# Patient Record
Sex: Female | Born: 1985 | Race: Black or African American | Hispanic: No | Marital: Single | Smoking: Never smoker
Health system: Southern US, Community
[De-identification: ages and names within clinical notes are randomized; demographics above are authoritative.]

## PROBLEM LIST (undated history)

## (undated) ENCOUNTER — Inpatient Hospital Stay (HOSPITAL_COMMUNITY): Payer: Self-pay

## (undated) DIAGNOSIS — Z789 Other specified health status: Secondary | ICD-10-CM

## (undated) HISTORY — PX: NO PAST SURGERIES: SHX2092

---

## 2010-10-30 ENCOUNTER — Emergency Department (HOSPITAL_COMMUNITY)
Admission: EM | Admit: 2010-10-30 | Discharge: 2010-10-30 | Payer: Self-pay | Source: Home / Self Care | Admitting: Emergency Medicine

## 2010-11-06 LAB — URINALYSIS, ROUTINE W REFLEX MICROSCOPIC
Bilirubin Urine: NEGATIVE
Hgb urine dipstick: NEGATIVE
Ketones, ur: NEGATIVE mg/dL
Nitrite: NEGATIVE
Protein, ur: NEGATIVE mg/dL
Specific Gravity, Urine: 1.018 (ref 1.005–1.030)
Urine Glucose, Fasting: NEGATIVE mg/dL
Urobilinogen, UA: 0.2 mg/dL (ref 0.0–1.0)
pH: 7 (ref 5.0–8.0)

## 2010-11-06 LAB — POCT I-STAT, CHEM 8
BUN: 11 mg/dL (ref 6–23)
Calcium, Ion: 1.16 mmol/L (ref 1.12–1.32)
Chloride: 105 mEq/L (ref 96–112)
Creatinine, Ser: 0.8 mg/dL (ref 0.4–1.2)
Glucose, Bld: 84 mg/dL (ref 70–99)
HCT: 36 % (ref 36.0–46.0)
Hemoglobin: 12.2 g/dL (ref 12.0–15.0)
Potassium: 3.8 mEq/L (ref 3.5–5.1)
Sodium: 141 mEq/L (ref 135–145)
TCO2: 27 mmol/L (ref 0–100)

## 2010-11-06 LAB — URINE CULTURE
Colony Count: NO GROWTH
Culture  Setup Time: 201201090455
Culture: NO GROWTH

## 2010-11-06 LAB — POCT PREGNANCY, URINE: Preg Test, Ur: NEGATIVE

## 2010-12-09 ENCOUNTER — Emergency Department (HOSPITAL_COMMUNITY): Payer: Self-pay

## 2010-12-09 ENCOUNTER — Inpatient Hospital Stay (INDEPENDENT_AMBULATORY_CARE_PROVIDER_SITE_OTHER)
Admission: RE | Admit: 2010-12-09 | Discharge: 2010-12-09 | Disposition: A | Discharge status: Home / Self Care | Payer: Self-pay | Source: Ambulatory Visit | Attending: Family Medicine | Admitting: Family Medicine

## 2010-12-09 ENCOUNTER — Emergency Department (HOSPITAL_COMMUNITY)
Admission: EM | Admit: 2010-12-09 | Discharge: 2010-12-09 | Disposition: A | Discharge status: Home / Self Care | Payer: Self-pay | Attending: Emergency Medicine | Admitting: Emergency Medicine

## 2010-12-09 DIAGNOSIS — N760 Acute vaginitis: Secondary | ICD-10-CM | POA: Insufficient documentation

## 2010-12-09 DIAGNOSIS — O2390 Unspecified genitourinary tract infection in pregnancy, unspecified trimester: Secondary | ICD-10-CM | POA: Insufficient documentation

## 2010-12-09 DIAGNOSIS — R110 Nausea: Secondary | ICD-10-CM

## 2010-12-09 DIAGNOSIS — B9689 Other specified bacterial agents as the cause of diseases classified elsewhere: Secondary | ICD-10-CM | POA: Insufficient documentation

## 2010-12-09 DIAGNOSIS — Z3201 Encounter for pregnancy test, result positive: Secondary | ICD-10-CM | POA: Insufficient documentation

## 2010-12-09 DIAGNOSIS — O210 Mild hyperemesis gravidarum: Secondary | ICD-10-CM | POA: Insufficient documentation

## 2010-12-09 DIAGNOSIS — R112 Nausea with vomiting, unspecified: Secondary | ICD-10-CM

## 2010-12-09 DIAGNOSIS — Z331 Pregnant state, incidental: Secondary | ICD-10-CM

## 2010-12-09 DIAGNOSIS — A499 Bacterial infection, unspecified: Secondary | ICD-10-CM | POA: Insufficient documentation

## 2010-12-09 DIAGNOSIS — R1013 Epigastric pain: Secondary | ICD-10-CM

## 2010-12-09 LAB — POCT URINALYSIS DIPSTICK
Bilirubin Urine: NEGATIVE
Hgb urine dipstick: NEGATIVE
Ketones, ur: NEGATIVE mg/dL
Nitrite: NEGATIVE
Protein, ur: NEGATIVE mg/dL
Specific Gravity, Urine: 1.015 (ref 1.005–1.030)
Urine Glucose, Fasting: NEGATIVE mg/dL
Urobilinogen, UA: 0.2 mg/dL (ref 0.0–1.0)
pH: 7 (ref 5.0–8.0)

## 2010-12-09 LAB — WET PREP, GENITAL
Trich, Wet Prep: NONE SEEN
WBC, Wet Prep HPF POC: NONE SEEN
Yeast Wet Prep HPF POC: NONE SEEN

## 2010-12-09 LAB — BASIC METABOLIC PANEL
BUN: 7 mg/dL (ref 6–23)
CO2: 23 mEq/L (ref 19–32)
Calcium: 10.3 mg/dL (ref 8.4–10.5)
Chloride: 103 mEq/L (ref 96–112)
Creatinine, Ser: 0.66 mg/dL (ref 0.4–1.2)
GFR calc Af Amer: >60 mL/min (ref >60)
GFR calc non Af Amer: >60 mL/min (ref >60)
Glucose, Bld: 76 mg/dL (ref 70–99)
Potassium: 4 mEq/L (ref 3.5–5.1)
Sodium: 134 mEq/L — ABNORMAL LOW (ref 135–145)

## 2010-12-09 LAB — URINALYSIS, ROUTINE W REFLEX MICROSCOPIC
Bilirubin Urine: NEGATIVE
Hgb urine dipstick: NEGATIVE
Ketones, ur: 40 mg/dL — AB
Nitrite: NEGATIVE
Protein, ur: NEGATIVE mg/dL
Specific Gravity, Urine: 1.023 (ref 1.005–1.030)
Urine Glucose, Fasting: NEGATIVE mg/dL
Urobilinogen, UA: 1 mg/dL (ref 0.0–1.0)
pH: 6.5 (ref 5.0–8.0)

## 2010-12-09 LAB — DIFFERENTIAL
Basophils Absolute: 0 10*3/uL (ref 0.0–0.1)
Basophils Relative: 0 % (ref 0–1)
Eosinophils Absolute: 0 10*3/uL (ref 0.0–0.7)
Eosinophils Relative: 1 % (ref 0–5)
Lymphocytes Relative: 27 % (ref 12–46)
Lymphs Abs: 2.3 10*3/uL (ref 0.7–4.0)
Monocytes Absolute: 0.5 10*3/uL (ref 0.1–1.0)
Monocytes Relative: 7 % (ref 3–12)
Neutro Abs: 5.5 10*3/uL (ref 1.7–7.7)
Neutrophils Relative %: 65 % (ref 43–77)

## 2010-12-09 LAB — HCG, QUANTITATIVE, PREGNANCY: hCG, Beta Chain, Quant, S: 48608 m[IU]/mL — ABNORMAL HIGH (ref <5)

## 2010-12-09 LAB — URINE MICROSCOPIC-ADD ON

## 2010-12-09 LAB — ABO/RH: ABO/RH(D): O POS

## 2010-12-09 LAB — CBC
HCT: 36.1 % (ref 36.0–46.0)
Hemoglobin: 12.9 g/dL (ref 12.0–15.0)
MCH: 29.1 pg (ref 26.0–34.0)
MCHC: 35.7 g/dL (ref 30.0–36.0)
MCV: 81.5 fL (ref 78.0–100.0)
Platelets: 255 10*3/uL (ref 150–400)
RBC: 4.43 MIL/uL (ref 3.87–5.11)
RDW: 12.1 % (ref 11.5–15.5)
WBC: 8.4 10*3/uL (ref 4.0–10.5)

## 2010-12-09 LAB — POCT PREGNANCY, URINE
Preg Test, Ur: POSITIVE
Preg Test, Ur: POSITIVE

## 2010-12-11 LAB — GC/CHLAMYDIA PROBE AMP, GENITAL
Chlamydia, DNA Probe: NEGATIVE
GC Probe Amp, Genital: NEGATIVE

## 2010-12-12 LAB — URINE CULTURE
Colony Count: 80000
Culture  Setup Time: 201202190152

## 2010-12-18 ENCOUNTER — Observation Stay (HOSPITAL_COMMUNITY)
Admission: EM | Admit: 2010-12-18 | Discharge: 2010-12-19 | Disposition: A | Discharge status: Home / Self Care | Payer: Self-pay | Attending: Emergency Medicine | Admitting: Emergency Medicine

## 2010-12-18 DIAGNOSIS — O210 Mild hyperemesis gravidarum: Principal | ICD-10-CM | POA: Insufficient documentation

## 2010-12-18 LAB — BASIC METABOLIC PANEL
BUN: 11 mg/dL (ref 6–23)
CO2: 25 mEq/L (ref 19–32)
Calcium: 12.1 mg/dL — ABNORMAL HIGH (ref 8.4–10.5)
Chloride: 103 mEq/L (ref 96–112)
Creatinine, Ser: 0.78 mg/dL (ref 0.4–1.2)
GFR calc Af Amer: >60 mL/min (ref >60)
GFR calc non Af Amer: >60 mL/min (ref >60)
Glucose, Bld: 90 mg/dL (ref 70–99)
Potassium: 4.1 mEq/L (ref 3.5–5.1)
Sodium: 137 mEq/L (ref 135–145)

## 2010-12-18 LAB — CBC
HCT: 42 % (ref 36.0–46.0)
Hemoglobin: 14.8 g/dL (ref 12.0–15.0)
MCH: 28.6 pg (ref 26.0–34.0)
MCHC: 35.2 g/dL (ref 30.0–36.0)
MCV: 81.2 fL (ref 78.0–100.0)
Platelets: 334 10*3/uL (ref 150–400)
RBC: 5.17 MIL/uL — ABNORMAL HIGH (ref 3.87–5.11)
RDW: 12.1 % (ref 11.5–15.5)
WBC: 8.6 10*3/uL (ref 4.0–10.5)

## 2010-12-18 LAB — DIFFERENTIAL
Basophils Absolute: 0 10*3/uL (ref 0.0–0.1)
Basophils Relative: 1 % (ref 0–1)
Eosinophils Absolute: 0 10*3/uL (ref 0.0–0.7)
Eosinophils Relative: 0 % (ref 0–5)
Lymphocytes Relative: 18 % (ref 12–46)
Lymphs Abs: 1.6 10*3/uL (ref 0.7–4.0)
Monocytes Absolute: 0.5 10*3/uL (ref 0.1–1.0)
Monocytes Relative: 6 % (ref 3–12)
Neutro Abs: 6.4 10*3/uL (ref 1.7–7.7)
Neutrophils Relative %: 75 % (ref 43–77)

## 2010-12-18 LAB — POCT PREGNANCY, URINE: Preg Test, Ur: POSITIVE

## 2010-12-19 LAB — URINALYSIS, ROUTINE W REFLEX MICROSCOPIC
Bilirubin Urine: NEGATIVE
Hgb urine dipstick: NEGATIVE
Ketones, ur: >80 mg/dL — AB
Nitrite: NEGATIVE
Protein, ur: NEGATIVE mg/dL
Specific Gravity, Urine: 1.031 — ABNORMAL HIGH (ref 1.005–1.030)
Urine Glucose, Fasting: NEGATIVE mg/dL
Urobilinogen, UA: 1 mg/dL (ref 0.0–1.0)
pH: 6 (ref 5.0–8.0)

## 2011-02-25 IMAGING — US US OB < 14 WEEKS - US OB TV
1 series · 14 of 28 positions shown · non-contrast
Comparison: None.

CLINICAL DATA: *RADIOLOGY REPORT*
CLINICAL DATA: transvaginal scan done; ; ABDOMINAL PAIN.

OBSTETRIC <14 WK US AND TRANSVAGINAL OB US
TECHNIQUE: Both transabdominal and transvaginal ultrasound
examinations were performed for complete evaluation of the
gestation as well as the maternal uterus, adnexal regions, and
pelvic cul-de-sac.

[Series 1: us ob < 14 weeks - us ob tv · 0.24mm/px · 14 of 65 slices shown]
[im 3/65]
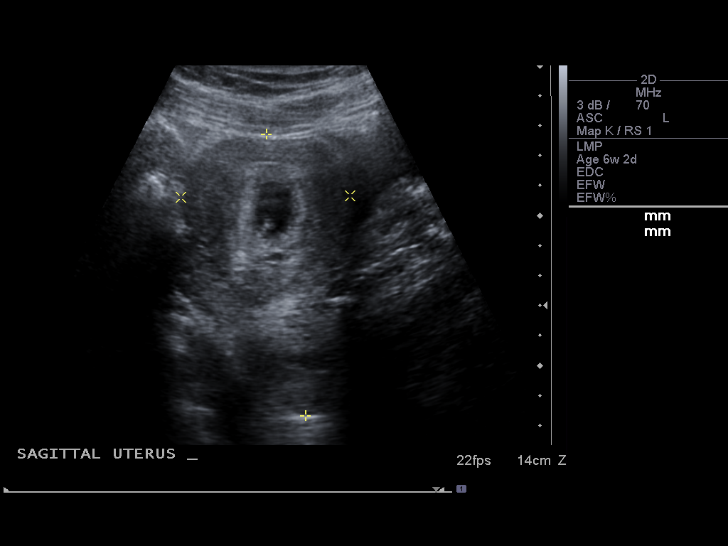
[im 8/65]
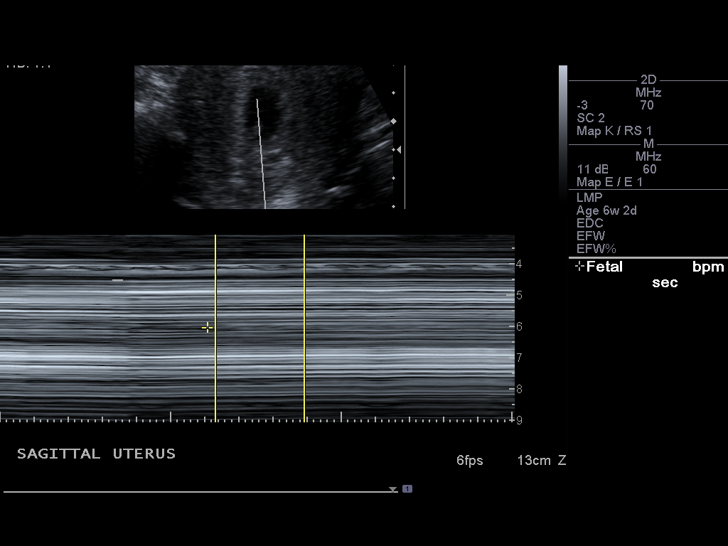
[im 12/65]
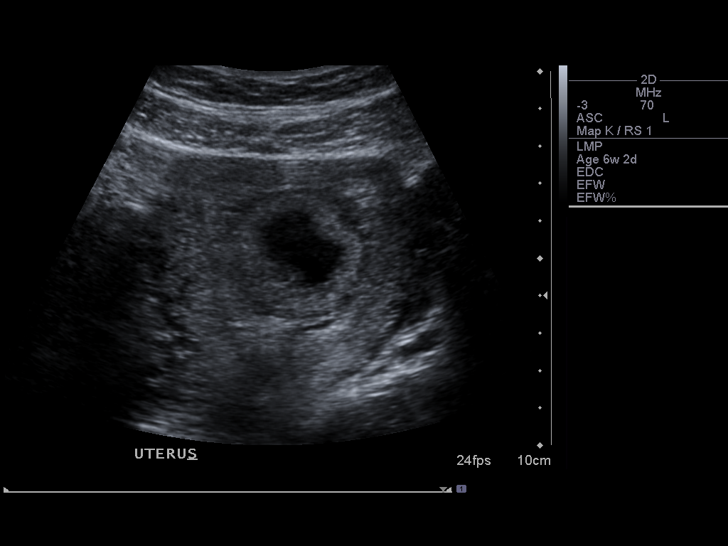
[im 17/65]
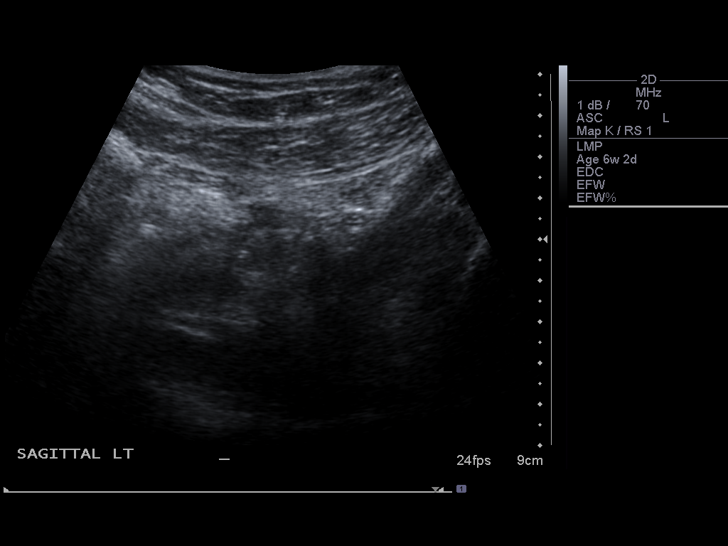
[im 22/65]
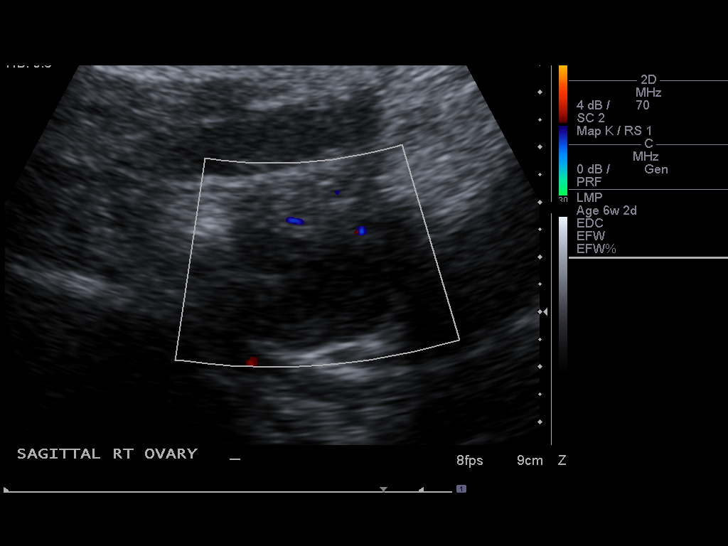
[im 27/65]
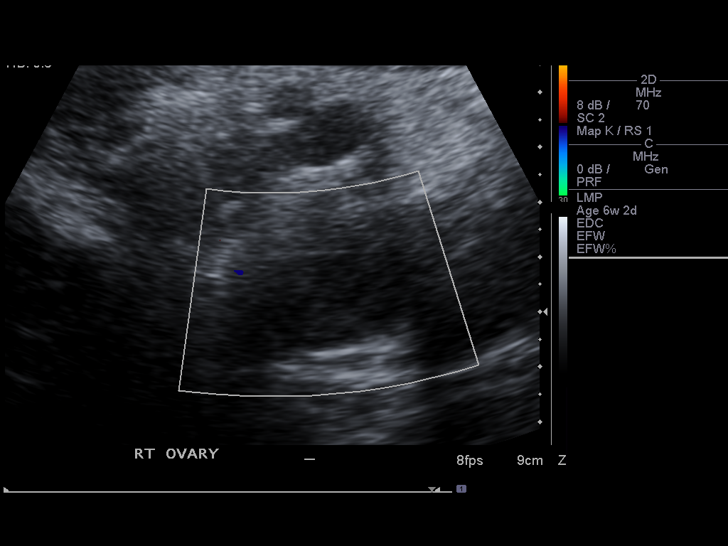
[im 31/65]
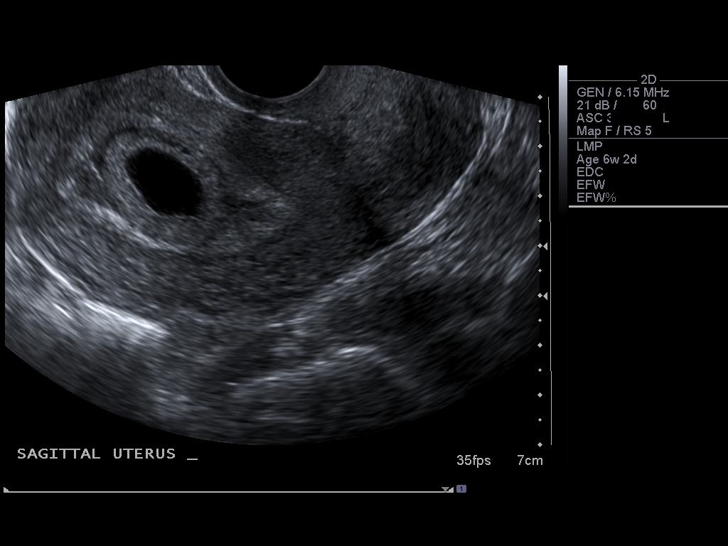
[im 36/65]
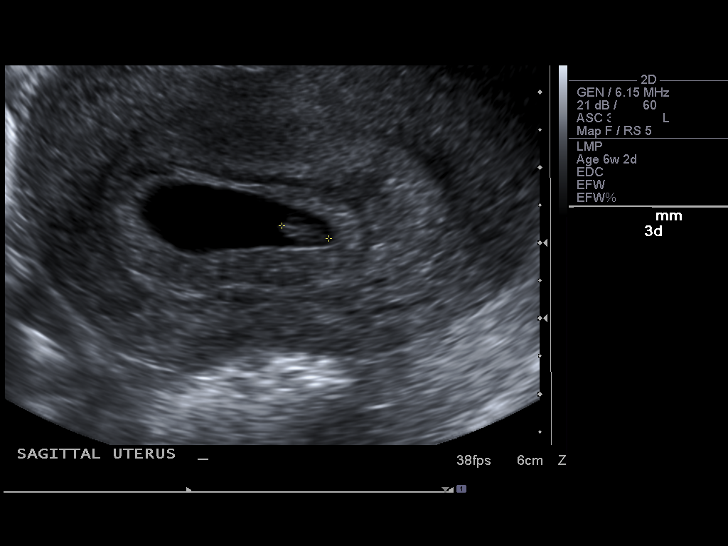
[im 41/65]
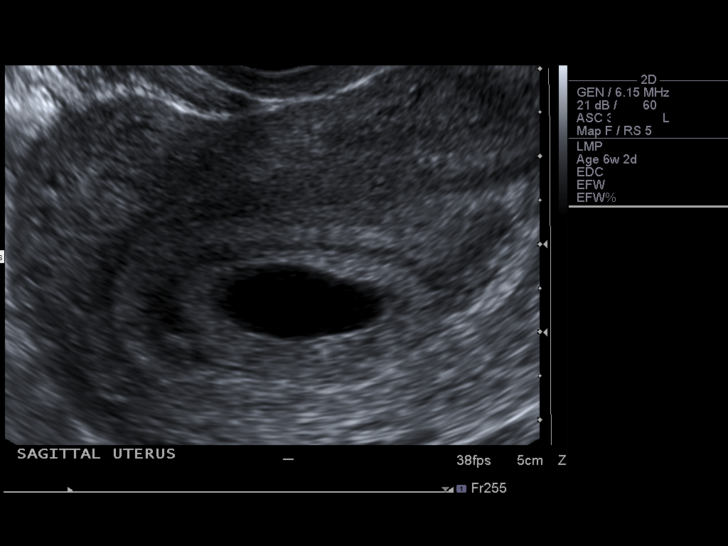
[im 46/65]
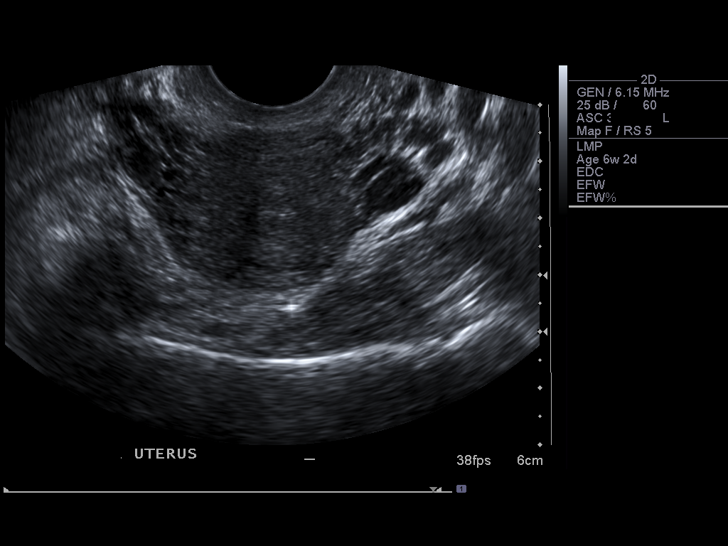
[im 50/65]
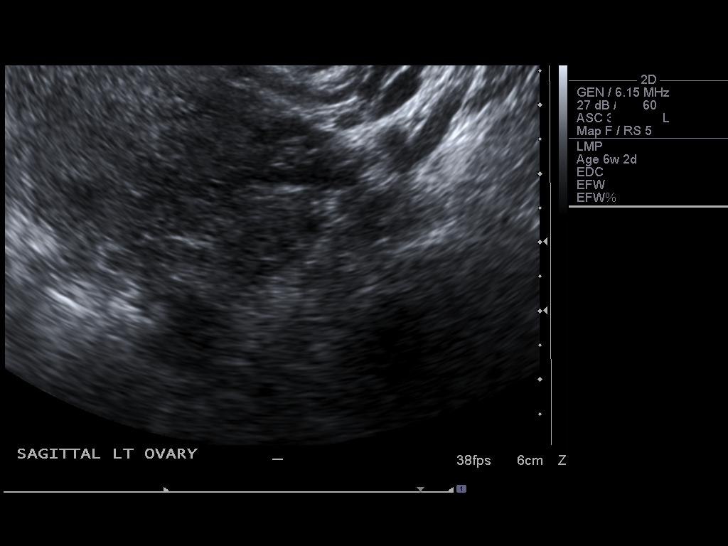
[im 55/65]
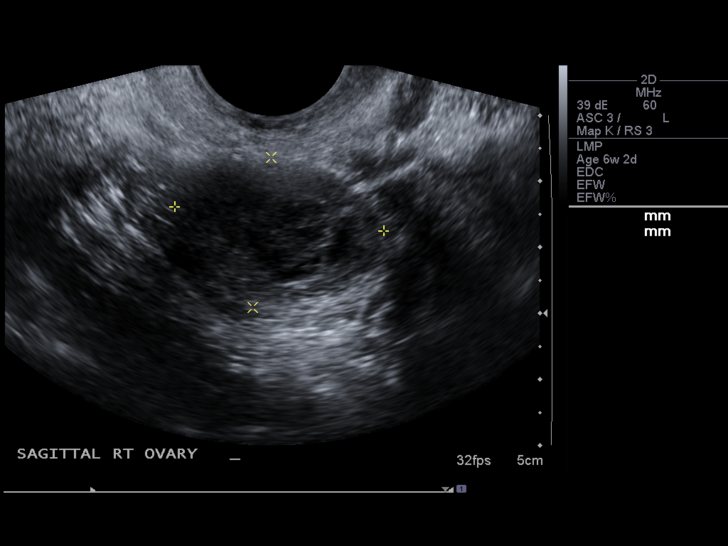
[im 60/65]
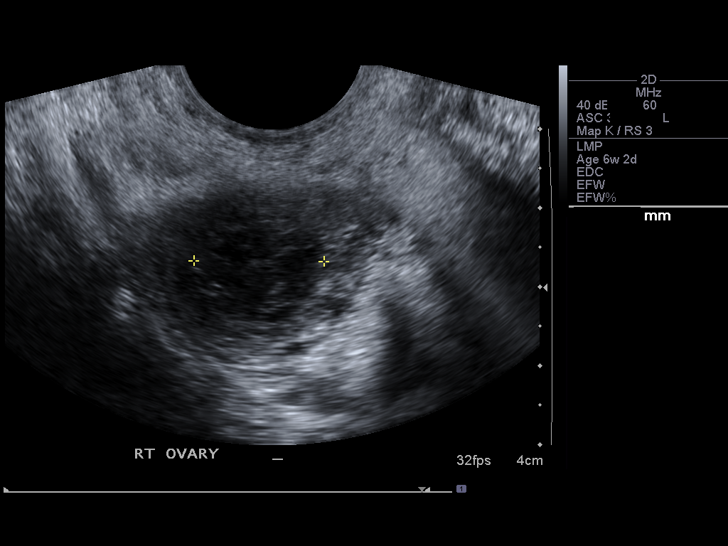
[im 65/65]
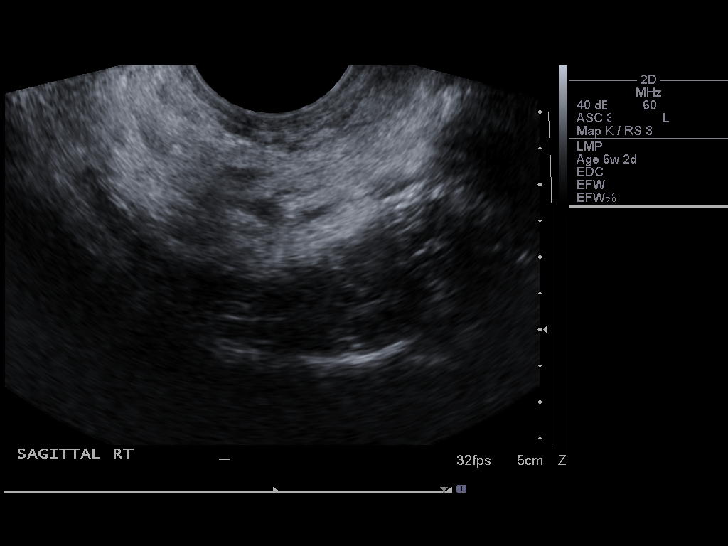

[14 of 28 positions shown; findings below may reference images not displayed]

FINDINGS: Single intrauterine pregnancy with crown-rump length of 6
mm.  Estimated gestational age is 6 weeks 3 days.  Fetal heart rate
119 beats per minute.  No subchorionic hemorrhage.

Ovaries are symmetric in size and echotexture.  Right corpus luteum
cyst present.  No free fluid.
IMPRESSION: 6-week-2-day intrauterine pregnancy.  Fetal heart rate 119 beats
per minute.

## 2011-05-16 ENCOUNTER — Encounter (HOSPITAL_COMMUNITY): Payer: Self-pay | Admitting: *Deleted

## 2011-05-16 ENCOUNTER — Inpatient Hospital Stay (HOSPITAL_COMMUNITY)
Admission: AD | Admit: 2011-05-16 | Discharge: 2011-05-17 | Disposition: A | Discharge status: Home / Self Care | Payer: Self-pay | Source: Ambulatory Visit | Attending: Obstetrics and Gynecology | Admitting: Obstetrics and Gynecology

## 2011-05-16 DIAGNOSIS — M62830 Muscle spasm of back: Secondary | ICD-10-CM

## 2011-05-16 DIAGNOSIS — E860 Dehydration: Secondary | ICD-10-CM | POA: Insufficient documentation

## 2011-05-16 DIAGNOSIS — O99891 Other specified diseases and conditions complicating pregnancy: Secondary | ICD-10-CM | POA: Insufficient documentation

## 2011-05-16 DIAGNOSIS — M5380 Other specified dorsopathies, site unspecified: Secondary | ICD-10-CM | POA: Insufficient documentation

## 2011-05-16 HISTORY — DX: Other specified health status: Z78.9

## 2011-05-16 LAB — URINE MICROSCOPIC-ADD ON

## 2011-05-16 LAB — URINALYSIS, ROUTINE W REFLEX MICROSCOPIC
Bilirubin Urine: NEGATIVE
Glucose, UA: NEGATIVE mg/dL
Hgb urine dipstick: NEGATIVE
Ketones, ur: NEGATIVE mg/dL
Nitrite: NEGATIVE
Protein, ur: NEGATIVE mg/dL
Specific Gravity, Urine: 1.025 (ref 1.005–1.030)
Urobilinogen, UA: 0.2 mg/dL (ref 0.0–1.0)
pH: 6.5 (ref 5.0–8.0)

## 2011-05-16 MED ORDER — ONDANSETRON HCL 4 MG/2ML IJ SOLN
4.0000 mg | Freq: Once | INTRAMUSCULAR | Status: AC
  Administered 2011-05-16: 4 mg via INTRAVENOUS
  Filled 2011-05-16: qty 2

## 2011-05-16 MED ORDER — DEXTROSE IN LACTATED RINGERS 5 % IV SOLN
Freq: Once | INTRAVENOUS | Status: AC
  Administered 2011-05-16: via INTRAVENOUS

## 2011-05-16 MED ORDER — CYCLOBENZAPRINE HCL 10 MG PO TABS
10.0000 mg | ORAL_TABLET | Freq: Once | ORAL | Status: AC
  Administered 2011-05-16: 10 mg via ORAL
  Filled 2011-05-16: qty 1

## 2011-05-16 NOTE — ED Notes (Signed)
Pt states that she needs a note for the homeless shelter stating that she should be allowed to have food and drink with her at all times due to pregnancy.

## 2011-05-16 NOTE — ED Notes (Signed)
Pt states that she will have no ride home, is currently living at the [facility].  Tanya, ac, called- informed pt that we will give her a cab voucher for this visit, but that cab vouchers are not typically given to pt on a regular basis.

## 2011-05-16 NOTE — ED Notes (Signed)
Pt tracing for Charlotte Reese on 05/16/2011 starting at 2211 stored under [id number] in error.  Paper tracing sent to Medical Record to be placed in correct chart.

## 2011-05-16 NOTE — Progress Notes (Signed)
Pt in via ems for severe lower back pain.  States it has been going on a couple days but today was worse.  Worse with movement.  Reports occasional abdominal pain, non current.  Reports nausea and vomiting since 2130.  + FM.  Denies any bleeding or lof.

## 2011-05-17 MED ORDER — FAMOTIDINE 40 MG PO TABS: 40.0000 mg | ORAL_TABLET | Freq: Every evening | ORAL | Status: DC

## 2011-05-17 MED ORDER — PROMETHAZINE HCL 12.5 MG PO TABS: 12.5000 mg | ORAL_TABLET | Freq: Four times a day (QID) | ORAL | Status: AC | PRN

## 2011-05-17 MED ORDER — FAMOTIDINE IN NACL 20-0.9 MG/50ML-% IV SOLN
20.0000 mg | Freq: Once | INTRAVENOUS | Status: AC
  Administered 2011-05-17: 20 mg via INTRAVENOUS
  Filled 2011-05-17: qty 50

## 2011-05-17 NOTE — Discharge Instructions (Signed)
Dehydration Dehydration is the reduction of water and fluid from the body to a level below that required for proper functioning. CAUSES Dehydration occurs when there is excessive fluid loss from the body or when loss of normal fluids is not adequately replaced.  Loss of fluids occurs in vomiting, diarrhea, excessive sweating, excessive urine output, or excessive loss of fluid from the lungs (as occurs in fever or in patients on a ventilator).   Inadequate fluid replacement occurs with nausea or decreased appetite due to illness, sore throat, or mouth pain.  SYMPTOMS Mild dehydration  Thirst (infants and young children may not be able to tell you they are thirsty).   Dry lips.   Slightly dry mouth membranes.  Moderate dehydration  Very dry mouth membranes.   Sunken eyes.   Sunken soft spot (fontanelle) on infant's head.   Skin does not bounce back quickly when lightly pinched and released.   Decreased urine production.   Decreased tear production.  Severe dehydration  Rapid, weak pulse (more than 100 beats per minute at rest).   Cold hands and feet.   Loss of ability to sweat in spite of heat and temperature.   Rapid breathing.   Blue lips.   Confusion, lethargy, difficult to arouse.   Minimal urine production.   No tears.  DIAGNOSIS Your caregiver will diagnose dehydration based on your symptoms and your exam. Blood and urine tests will help confirm the diagnosis. The diagnostic evaluation should also identify the cause of dehydration. PREVENTION The body depends on a proper balance of fluid and salts (electrolytes) for normal function. Adequate fluid intake in the presence of illness or other stresses (such as extreme exercise) is important.  TREATMENT  Mild dehydration is safe to self-treat for most ages as long as it does not worsen. Contact your caregiver for even mild dehydration in infants and the elderly.   In teenagers and adults with moderate  dehydration, careful home treatment (as outlined below) can be safe. Phone contact with a caregiver is advised. Children under 80 years of age with moderate dehydration should see a caregiver.   If you or your child is severely dehydrated, go to a hospital for treatment. Intravenous (IV) fluids will quickly reverse dehydration and are often lifesaving in young children, infants, and elderly persons.  HOME CARE INSTRUCTIONS Small amounts of fluids should be taken frequently. Large amounts at one time may not be tolerated. Plain water may be harmful in infants and the elderly. Oral rehydration solutions (ORS) are available at pharmacies and grocery stores. ORS replaces water and important electrolytes in proper proportions. Sports drinks are not as effective as ORS and may be harmful because the sugar can make diarrhea worse.  As a general guideline for children, replace any new fluid losses from diarrhea and/or vomiting with ORS as follows:   If your child weighs 22 pounds or under (10 kg or less), give 60-120 mL (1/4-1/2 cup or 2-4 ounces) of ORS for each diarrheal stool or vomiting episode.   If your child weighs more than 22 pounds (more than 10 kg), give 120-240 mL (1/2-1 cup or 4-8 ounces) of ORS for each diarrheal stool or vomiting episode.  If your child is vomiting, it may be helpful to give the above ORS replacement in 5 mL (1 teaspoon) amounts every 5 minutes and increase as tolerated.   While correcting for dehydration, children should eat normally. However, foods high in sugar should be avoided because they may worsen diarrhea. Large  amounts of carbonated soft drinks, juice, gelatin desserts, and other highly sugared drinks should be avoided.   After correction of dehydration, other liquids that are appealing to the child may be added. Children should drink small amounts of fluids frequently and fluids should be increased as tolerated. Children should drink enough fluids to keep urine  clear or pale yellow.  Adults should eat normally while drinking more fluids than usual. Drink small amounts of fluids frequently and increase the amount as tolerated. Drink enough fluids to keep urine clear or pale yellow. Broths, weak decaffeinated tea, lemon-lime soft drinks (allowed to go flat), and ORS replace fluids and electrolytes.  Avoid:  Carbonated drinks.  Juice.   Extremely hot or cold fluids.   Caffeine drinks.   Fatty, greasy foods.   Alcohol.  Tobacco.   Too much intake of anything at one time.   Gelatin desserts.    Probiotics are active cultures of beneficial bacteria. They may lessen the amount and number of diarrheal stools in adults. Probiotics can be found in yogurt with active cultures and in supplements.   Wash your hands well to avoid spreading germs (bacteria) and viruses.   Antidiarrheal medicines are not recommended for infants and children.   Only take over-the-counter or prescription medicines for pain, discomfort, or fever as directed by your caregiver. Do not give aspirin to children.   For adults with dehydration, ask your caregiver if you should continue all prescribed and over-the-counter medicines.   If your caregiver has given you a follow-up appointment, it is very important to keep that appointment. Not keeping the appointment could result in a lasting (chronic) or permanent injury and disability. If there is any problem keeping the appointment, you must call to reschedule.  SEEK IMMEDIATE MEDICAL CARE IF:  You are unable to keep fluids down or other symptoms become worse despite treatment.   Vomiting or diarrhea develops and becomes persistent.   There is vomiting of blood or green matter (bile).   There is blood in the stool or the stools are black and tarry.   There is no urine output in 6 to 8 hours or there is only a small amount of very dark urine.   Abdominal pain develops, increases, or localizes.  You or your child has an  oral temperature above 100.5 Muscle Cramps Muscle cramps are due to sudden involuntary muscle contraction. This means you have no control over the tightening of a muscle (or muscles). Often there are no obvious causes. Muscle cramps may occur with over-exertion. They may also occur with chilling of the muscles. An example of a muscle chilling activity is swimming. It is uncommon for cramps to be due to a serious underlying disorder. In most cases, muscle cramps improve (or leave) within minutes. CAUSES Some common causes are due to: Injury.  Infections, especially viral.  Abnormal levels of the salts and ions in your blood (electrolytes). This could happen if you are taking water pills (diuretics).  Blood vessel disease where not enough blood is getting to the muscles (intermittent claudication).  SOME UNCOMMON CAUSES OF MUSCLE CRAMPS ARE: Side effects of some medicine (such as lithium).  Alcohol abuse.  Diseases where there is soreness (inflammation) of the muscular system.  HOME CARE INSTRUCTIONS It may be helpful to massage, stretch, and relax the affected muscle.  Taking a dose of over-the-counter Benadryl (dephenhydramine) is helpful for night leg cramps.  Tonic water that contains quinine may be helpful.  SEEK MEDICAL CARE IF:  Cramps are frequent and not relieved with medicine.  MAKE SURE YOU:  Understand these instructions.  Will watch your condition.  Will get help right away if you are not doing well or get worse.  Document Released: 03/30/2002 Document Re-Released: 01/04/2009  [facility] Patient Information 2011 [place], [place]., not controlled by medicine.   Your baby is older than 3 months with a rectal temperature of 102.52F (38.9 C) or higher.   Your baby is 35 months old or younger with a rectal temperature of 100.4 F (38 C) or higher.   You develop excessive weakness, dizziness, fainting, or extreme thirst.   You develop a rash, stiff neck, severe headache, or you  become irritable, sleepy, or difficult to awaken.  MAKE SURE YOU:  Understand these instructions.   Will watch your condition.   Will get help right away if you are not doing well or get worse.  Document Released: 10/08/2005 Document Re-Released: 01/02/2010 [facility] Patient Information 2011 [place], [place].

## 2011-05-17 NOTE — ED Notes (Signed)
Taxi voucher to [employer]

## 2011-08-10 ENCOUNTER — Encounter (HOSPITAL_COMMUNITY): Payer: Self-pay | Admitting: *Deleted

## 2012-02-12 NOTE — MAU Provider Note (Signed)
Chief Complaint:  Back Pain   Charlotte Reese is  26 y.o. G1P0.  [redacted]weeks gestation. She presents complaining of Back Pain . Pt in via ems for severe lower back pain. States it has been going on a couple days but today was worse. Worse with movement. Reports occasional abdominal pain, non current. Reports nausea and vomiting since 2130. + FM. Denies any bleeding or lof.   Obstetrical/Gynecological History: OB History as of 08/10/11    Grav Para Term Preterm Abortions TAB SAB Ect Mult Living   1               Past Medical History: Past Medical History  Diagnosis Date  . No pertinent past medical history     Past Surgical History: Past Surgical History  Procedure Date  . No past surgeries     Family History: No family history on file.  Social History: History  Substance Use Topics  . Smoking status: Never Smoker   . Smokeless tobacco: Not on file  . Alcohol Use: No    Allergies: No Known Allergies  No prescriptions prior to admission    Review of Systems - Negative except what has been reviewed in the HPI  Physical Exam   Blood pressure 112/55, pulse 91, temperature 97 F (36.1 C), temperature source Oral, resp. rate 16, height 5\' 6"  (1.676 m), weight 180 lb (81.647 kg), unknown if currently breastfeeding.  General: General appearance - alert, well appearing, and in no distress and oriented to person, place, and time Mental status - alert, oriented to person, place, and time, normal mood, behavior, speech, dress, motor activity, and thought processes, affect appropriate to mood Abdomen - gravid, non tender Back exam - full range of motion, no tenderness,  Positivepalpable spasm No pain on motion, no CVAT Musculoskeletal - no joint tenderness, deformity or swelling Extremities - peripheral pulses normal, no pedal edema, no clubbing or cyanosis Focused Gynecological Exam: cervix: closed/thick/post FHT: 130, mod variability, +15x15 no decels Toco: No  contractions  Labs: UA Neg  Assessment: 1. Spasm of back muscles   2. Dehydration     Plan: Discharge home Comfort measures reviewed   Keiyon Plack E. 02/12/2012,10:06 AM

## 2014-08-23 ENCOUNTER — Encounter (HOSPITAL_COMMUNITY): Payer: Self-pay | Admitting: *Deleted

## 2016-02-11 ENCOUNTER — Encounter (HOSPITAL_COMMUNITY): Payer: Self-pay | Admitting: *Deleted

## 2016-02-11 DIAGNOSIS — Y998 Other external cause status: Secondary | ICD-10-CM | POA: Insufficient documentation

## 2016-02-11 DIAGNOSIS — S3992XA Unspecified injury of lower back, initial encounter: Secondary | ICD-10-CM | POA: Insufficient documentation

## 2016-02-11 DIAGNOSIS — Y92410 Unspecified street and highway as the place of occurrence of the external cause: Secondary | ICD-10-CM | POA: Diagnosis not present

## 2016-02-11 DIAGNOSIS — Y9389 Activity, other specified: Secondary | ICD-10-CM | POA: Insufficient documentation

## 2016-02-11 DIAGNOSIS — R200 Anesthesia of skin: Secondary | ICD-10-CM | POA: Insufficient documentation

## 2016-02-11 DIAGNOSIS — S199XXA Unspecified injury of neck, initial encounter: Secondary | ICD-10-CM | POA: Diagnosis not present
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
## 2016-02-12 ENCOUNTER — Emergency Department (HOSPITAL_COMMUNITY): Payer: Medicaid - Out of State

## 2016-02-12 ENCOUNTER — Emergency Department (HOSPITAL_COMMUNITY)
Admission: EM | Admit: 2016-02-12 | Discharge: 2016-02-12 | Disposition: A | Discharge status: Home / Self Care | Payer: Medicaid - Out of State | Attending: Emergency Medicine | Admitting: Emergency Medicine

## 2016-02-12 DIAGNOSIS — R200 Anesthesia of skin: Secondary | ICD-10-CM

## 2016-02-12 MED ORDER — ACETAMINOPHEN 500 MG PO TABS
1000.0000 mg | ORAL_TABLET | Freq: Once | ORAL | Status: AC
  Administered 2016-02-12: 1000 mg via ORAL
  Filled 2016-02-12: qty 2
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
## 2016-02-12 NOTE — ED Notes (Signed)
MD Little at the bedside  

## 2016-02-12 NOTE — ED Notes (Signed)
Pt asked "What is wrong with me? They do not know why my arm is numb?" Pt reassured that the MRI did not show any abnormalties that would have caused her numbness. Pt reassured that she can follow up with a PCP that can further work her up. Today at the emergency department, we looked for the big things like strokes, and all of her results came back reassuring. Pt asked, "So they will tell me why it is affecting my gait and speech?" Pt has normal speech at this time. No slurred speech or expressive aphasia noted. Pt walked from wheelchair to car with no issues. MD Little made aware of patient's complaints and approved discharge.

## 2016-02-12 NOTE — ED Notes (Signed)
Patient transported to MRI 

## 2016-02-12 NOTE — Discharge Instructions (Signed)
Paresthesia Paresthesia is an abnormal burning or prickling sensation. This sensation is generally felt in the hands, arms, legs, or feet. However, it may occur in any part of the body. Usually, it is not painful. The feeling may be described as:  Tingling or numbness.  Pins and needles.  Skin crawling.  Buzzing.  Limbs falling asleep.  Itching. Most people experience temporary (transient) paresthesia at some time in their lives. Paresthesia may occur when you breathe too quickly (hyperventilation). It can also occur without any apparent cause. Commonly, paresthesia occurs when pressure is placed on a nerve. The sensation quickly goes away after the pressure is removed. For some people, however, paresthesia is a long-lasting (chronic) condition that is caused by an underlying disorder. If you continue to have paresthesia, you may need further medical evaluation. HOME CARE INSTRUCTIONS Watch your condition for any changes. Taking the following actions may help to lessen any discomfort that you are feeling:  Avoid drinking alcohol.  Try acupuncture or massage to help relieve your symptoms.  Keep all follow-up visits as directed by your health care provider. This is important. SEEK MEDICAL CARE IF:  You continue to have episodes of paresthesia.  Your burning or prickling feeling gets worse when you walk.  You have pain, cramps, or dizziness.  You develop a rash. SEEK IMMEDIATE MEDICAL CARE IF:  You feel weak.  You have trouble walking or moving.  You have problems with speech, understanding, or vision.  You feel confused.  You cannot control your bladder or bowel movements.  You have numbness after an injury.  You faint.   This information is not intended to replace advice given to you by your health care provider. Make sure you discuss any questions you have with your health care provider.   Document Released: 09/28/2002 Document Revised: 02/22/2015 Document Reviewed:  10/04/2014 Elsevier Interactive Patient Education 2016 [employer] Inc[employer]  

## 2024-03-15 ENCOUNTER — Emergency Department (HOSPITAL_COMMUNITY)
Admission: AD | Admit: 2024-03-15 | Discharge: 2024-03-16 | Disposition: A | Discharge status: Home / Self Care | Payer: Self-pay | Attending: Emergency Medicine | Admitting: Emergency Medicine

## 2024-03-15 DIAGNOSIS — Z3202 Encounter for pregnancy test, result negative: Secondary | ICD-10-CM

## 2024-03-15 DIAGNOSIS — Y92410 Unspecified street and highway as the place of occurrence of the external cause: Secondary | ICD-10-CM | POA: Diagnosis not present

## 2024-03-15 DIAGNOSIS — T148XXA Other injury of unspecified body region, initial encounter: Secondary | ICD-10-CM

## 2024-03-15 DIAGNOSIS — H578A2 Foreign body sensation, left eye: Secondary | ICD-10-CM | POA: Diagnosis present

## 2024-03-15 LAB — POCT PREGNANCY, URINE: Preg Test, Ur: NEGATIVE
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
## 2024-03-16 ENCOUNTER — Other Ambulatory Visit: Payer: Self-pay

## 2024-03-16 ENCOUNTER — Encounter (HOSPITAL_COMMUNITY): Payer: Self-pay | Admitting: Family Medicine

## 2024-03-16 MED ORDER — POLYMYXIN B-TRIMETHOPRIM 10000-0.1 UNIT/ML-% OP SOLN
1.0000 [drp] | OPHTHALMIC | Status: DC
  Filled 2024-03-16: qty 10

## 2024-03-16 MED ORDER — IBUPROFEN 800 MG PO TABS
800.0000 mg | ORAL_TABLET | Freq: Once | ORAL | Status: AC
  Administered 2024-03-16: 800 mg via ORAL
  Filled 2024-03-16: qty 1

## 2024-03-16 MED ORDER — ERYTHROMYCIN 5 MG/GM OP OINT
TOPICAL_OINTMENT | Freq: Once | OPHTHALMIC | Status: AC
  Filled 2024-03-16: qty 3.5

## 2024-03-16 MED ORDER — CYCLOBENZAPRINE HCL 10 MG PO TABS
10.0000 mg | ORAL_TABLET | Freq: Once | ORAL | Status: AC
  Administered 2024-03-16: 10 mg via ORAL
  Filled 2024-03-16: qty 1

## 2024-03-16 MED ORDER — CYCLOBENZAPRINE HCL 10 MG PO TABS: 10.0000 mg | ORAL_TABLET | Freq: Three times a day (TID) | ORAL | 0 refills | Status: AC | PRN

## 2024-03-16 MED ORDER — IBUPROFEN 800 MG PO TABS: 800.0000 mg | ORAL_TABLET | Freq: Three times a day (TID) | ORAL | 0 refills | Status: AC
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
